# Patient Record
Sex: Female | Born: 1950 | Race: White | Hispanic: No | Marital: Married | Smoking: Current every day smoker
Health system: Southern US, Community
[De-identification: ages and names within clinical notes are randomized; demographics above are authoritative.]

## PROBLEM LIST (undated history)

## (undated) DIAGNOSIS — Z98811 Dental restoration status: Secondary | ICD-10-CM

## (undated) DIAGNOSIS — M65319 Trigger thumb, unspecified thumb: Secondary | ICD-10-CM

## (undated) DIAGNOSIS — J349 Unspecified disorder of nose and nasal sinuses: Secondary | ICD-10-CM

## (undated) HISTORY — PX: ENDOMETRIAL ABLATION: SHX621

## (undated) HISTORY — PX: TONSILLECTOMY: SUR1361

## (undated) HISTORY — PX: OVARIAN CYST REMOVAL: SHX89

## (undated) HISTORY — PX: LAPAROSCOPY FOR ECTOPIC PREGNANCY: SUR765

---

## 1997-08-19 ENCOUNTER — Emergency Department (HOSPITAL_COMMUNITY): Admission: EM | Admit: 1997-08-19 | Discharge: 1997-08-19 | Payer: Self-pay | Admitting: Internal Medicine

## 1999-05-03 ENCOUNTER — Encounter: Admission: RE | Admit: 1999-05-03 | Discharge: 1999-05-03 | Payer: Self-pay | Admitting: Gynecology

## 1999-05-03 ENCOUNTER — Encounter: Payer: Self-pay | Admitting: Gynecology

## 1999-05-05 ENCOUNTER — Other Ambulatory Visit: Admission: RE | Admit: 1999-05-05 | Discharge: 1999-05-05 | Payer: Self-pay | Admitting: Gynecology

## 2001-11-28 ENCOUNTER — Other Ambulatory Visit: Admission: RE | Admit: 2001-11-28 | Discharge: 2001-11-28 | Payer: Self-pay | Admitting: Gynecology

## 2008-09-21 ENCOUNTER — Ambulatory Visit: Payer: Self-pay | Admitting: Diagnostic Radiology

## 2008-09-21 ENCOUNTER — Emergency Department (HOSPITAL_BASED_OUTPATIENT_CLINIC_OR_DEPARTMENT_OTHER): Admission: EM | Admit: 2008-09-21 | Discharge: 2008-09-21 | Payer: Self-pay | Admitting: Emergency Medicine

## 2009-02-25 ENCOUNTER — Encounter: Admission: RE | Admit: 2009-02-25 | Discharge: 2009-02-25 | Payer: Self-pay | Admitting: Family Medicine

## 2012-07-16 DIAGNOSIS — M65319 Trigger thumb, unspecified thumb: Secondary | ICD-10-CM

## 2012-07-16 HISTORY — DX: Trigger thumb, unspecified thumb: M65.319

## 2012-07-26 ENCOUNTER — Other Ambulatory Visit: Payer: Self-pay | Admitting: Orthopedic Surgery

## 2012-08-06 ENCOUNTER — Encounter (HOSPITAL_BASED_OUTPATIENT_CLINIC_OR_DEPARTMENT_OTHER): Payer: Self-pay | Admitting: *Deleted

## 2012-08-13 ENCOUNTER — Encounter (HOSPITAL_BASED_OUTPATIENT_CLINIC_OR_DEPARTMENT_OTHER)
Admission: RE | Disposition: A | Discharge status: Home / Self Care | Payer: Self-pay | Source: Ambulatory Visit | Attending: Orthopedic Surgery

## 2012-08-13 ENCOUNTER — Ambulatory Visit (HOSPITAL_BASED_OUTPATIENT_CLINIC_OR_DEPARTMENT_OTHER): Payer: BC Managed Care – PPO | Admitting: *Deleted

## 2012-08-13 ENCOUNTER — Encounter (HOSPITAL_BASED_OUTPATIENT_CLINIC_OR_DEPARTMENT_OTHER): Payer: Self-pay | Admitting: *Deleted

## 2012-08-13 ENCOUNTER — Ambulatory Visit (HOSPITAL_BASED_OUTPATIENT_CLINIC_OR_DEPARTMENT_OTHER)
Admission: RE | Admit: 2012-08-13 | Discharge: 2012-08-13 | Disposition: A | Discharge status: Home / Self Care | Payer: BC Managed Care – PPO | Source: Ambulatory Visit | Attending: Orthopedic Surgery | Admitting: Orthopedic Surgery

## 2012-08-13 DIAGNOSIS — F17200 Nicotine dependence, unspecified, uncomplicated: Secondary | ICD-10-CM | POA: Insufficient documentation

## 2012-08-13 DIAGNOSIS — M6530 Trigger finger, unspecified finger: Secondary | ICD-10-CM | POA: Insufficient documentation

## 2012-08-13 HISTORY — DX: Dental restoration status: Z98.811

## 2012-08-13 HISTORY — DX: Trigger thumb, unspecified thumb: M65.319

## 2012-08-13 HISTORY — DX: Unspecified disorder of nose and nasal sinuses: J34.9

## 2012-08-13 HISTORY — PX: TRIGGER FINGER RELEASE: SHX641

## 2012-08-13 SURGERY — RELEASE, A1 PULLEY, FOR TRIGGER FINGER
Anesthesia: General | Site: Thumb | Laterality: Bilateral | Wound class: Clean

## 2012-08-13 MED ORDER — MIDAZOLAM HCL 2 MG/ML PO SYRP: 12.0000 mg | ORAL_SOLUTION | Freq: Once | ORAL | Status: DC | PRN

## 2012-08-13 MED ORDER — BUPIVACAINE HCL (PF) 0.25 % IJ SOLN
INTRAMUSCULAR | Status: DC | PRN
  Administered 2012-08-13: 5 mL

## 2012-08-13 MED ORDER — LACTATED RINGERS IV SOLN
INTRAVENOUS | Status: DC
  Administered 2012-08-13: 09:00:00 via INTRAVENOUS

## 2012-08-13 MED ORDER — DEXAMETHASONE SODIUM PHOSPHATE 10 MG/ML IJ SOLN
INTRAMUSCULAR | Status: DC | PRN
  Administered 2012-08-13: 10 mg via INTRAVENOUS

## 2012-08-13 MED ORDER — CHLORHEXIDINE GLUCONATE 4 % EX LIQD: 60.0000 mL | Freq: Once | CUTANEOUS | Status: DC

## 2012-08-13 MED ORDER — LIDOCAINE HCL (CARDIAC) 20 MG/ML IV SOLN
INTRAVENOUS | Status: DC | PRN
  Administered 2012-08-13: 60 mg via INTRAVENOUS

## 2012-08-13 MED ORDER — PROPOFOL 10 MG/ML IV BOLUS
INTRAVENOUS | Status: DC | PRN
  Administered 2012-08-13: 150 mg via INTRAVENOUS

## 2012-08-13 MED ORDER — HYDROCODONE-ACETAMINOPHEN 5-325 MG PO TABS: 1.0000 | ORAL_TABLET | Freq: Four times a day (QID) | ORAL | Status: AC | PRN

## 2012-08-13 MED ORDER — OXYCODONE HCL 5 MG PO TABS: 5.0000 mg | ORAL_TABLET | Freq: Once | ORAL | Status: DC | PRN

## 2012-08-13 MED ORDER — OXYCODONE HCL 5 MG/5ML PO SOLN: 5.0000 mg | Freq: Once | ORAL | Status: DC | PRN

## 2012-08-13 MED ORDER — 0.9 % SODIUM CHLORIDE (POUR BTL) OPTIME
TOPICAL | Status: DC | PRN
  Administered 2012-08-13: 100 mL

## 2012-08-13 MED ORDER — MIDAZOLAM HCL 2 MG/2ML IJ SOLN: 1.0000 mg | INTRAMUSCULAR | Status: DC | PRN

## 2012-08-13 MED ORDER — FENTANYL CITRATE 0.05 MG/ML IJ SOLN
INTRAMUSCULAR | Status: DC | PRN
  Administered 2012-08-13: 100 ug via INTRAVENOUS

## 2012-08-13 MED ORDER — ONDANSETRON HCL 4 MG/2ML IJ SOLN
INTRAMUSCULAR | Status: DC | PRN
  Administered 2012-08-13: 4 mg via INTRAVENOUS

## 2012-08-13 MED ORDER — FENTANYL CITRATE 0.05 MG/ML IJ SOLN: 50.0000 ug | INTRAMUSCULAR | Status: DC | PRN

## 2012-08-13 MED ORDER — HYDROMORPHONE HCL PF 1 MG/ML IJ SOLN: 0.2500 mg | INTRAMUSCULAR | Status: DC | PRN

## 2012-08-13 MED ORDER — MIDAZOLAM HCL 5 MG/5ML IJ SOLN
INTRAMUSCULAR | Status: DC | PRN
  Administered 2012-08-13: 2 mg via INTRAVENOUS

## 2012-08-13 MED ORDER — CEFAZOLIN SODIUM-DEXTROSE 2-3 GM-% IV SOLR
2.0000 g | INTRAVENOUS | Status: AC
  Administered 2012-08-13: 2 g via INTRAVENOUS

## 2012-08-13 SURGICAL SUPPLY — 38 items
BANDAGE COBAN STERILE 2 (GAUZE/BANDAGES/DRESSINGS) ×2 IMPLANT
BLADE SURG 15 STRL LF DISP TIS (BLADE) ×1 IMPLANT
BLADE SURG 15 STRL SS (BLADE) ×1
BNDG ESMARK 4X9 LF (GAUZE/BANDAGES/DRESSINGS) ×2 IMPLANT
CHLORAPREP W/TINT 26ML (MISCELLANEOUS) ×4 IMPLANT
CLOTH BEACON ORANGE TIMEOUT ST (SAFETY) ×2 IMPLANT
CORDS BIPOLAR (ELECTRODE) IMPLANT
COVER MAYO STAND STRL (DRAPES) ×4 IMPLANT
COVER TABLE BACK 60X90 (DRAPES) ×2 IMPLANT
CUFF TOURNIQUET SINGLE 18IN (TOURNIQUET CUFF) ×4 IMPLANT
DECANTER SPIKE VIAL GLASS SM (MISCELLANEOUS) IMPLANT
DERMABOND ADVANCED (GAUZE/BANDAGES/DRESSINGS) ×2
DERMABOND ADVANCED .7 DNX12 (GAUZE/BANDAGES/DRESSINGS) ×2 IMPLANT
DRAPE EXTREMITY T 121X128X90 (DRAPE) ×4 IMPLANT
DRAPE SURG 17X23 STRL (DRAPES) ×4 IMPLANT
GAUZE XEROFORM 1X8 LF (GAUZE/BANDAGES/DRESSINGS) ×2 IMPLANT
GLOVE BIO SURGEON STRL SZ 6.5 (GLOVE) IMPLANT
GLOVE BIOGEL PI IND STRL 7.0 (GLOVE) ×1 IMPLANT
GLOVE BIOGEL PI IND STRL 8.5 (GLOVE) ×1 IMPLANT
GLOVE BIOGEL PI INDICATOR 7.0 (GLOVE) ×1
GLOVE BIOGEL PI INDICATOR 8.5 (GLOVE) ×1
GLOVE ECLIPSE 6.5 STRL STRAW (GLOVE) ×2 IMPLANT
GLOVE EXAM NITRILE MD LF STRL (GLOVE) ×2 IMPLANT
GLOVE SURG ORTHO 8.0 STRL STRW (GLOVE) ×4 IMPLANT
GOWN BRE IMP PREV XXLGXLNG (GOWN DISPOSABLE) ×2 IMPLANT
GOWN PREVENTION PLUS XLARGE (GOWN DISPOSABLE) ×2 IMPLANT
NEEDLE 27GAX1X1/2 (NEEDLE) ×2 IMPLANT
NS IRRIG 1000ML POUR BTL (IV SOLUTION) ×2 IMPLANT
PACK BASIN DAY SURGERY FS (CUSTOM PROCEDURE TRAY) ×2 IMPLANT
PADDING CAST ABS 4INX4YD NS (CAST SUPPLIES)
PADDING CAST ABS COTTON 4X4 ST (CAST SUPPLIES) IMPLANT
SPONGE GAUZE 4X4 12PLY (GAUZE/BANDAGES/DRESSINGS) ×2 IMPLANT
STOCKINETTE 4X48 STRL (DRAPES) ×4 IMPLANT
SUT VICRYL RAPIDE 4/0 PS 2 (SUTURE) ×2 IMPLANT
SYR BULB 3OZ (MISCELLANEOUS) ×2 IMPLANT
SYR CONTROL 10ML LL (SYRINGE) ×2 IMPLANT
TOWEL OR 17X24 6PK STRL BLUE (TOWEL DISPOSABLE) ×2 IMPLANT
UNDERPAD 30X30 INCONTINENT (UNDERPADS AND DIAPERS) ×4 IMPLANT

## 2012-08-13 NOTE — Anesthesia Procedure Notes (Signed)
Procedure Name: LMA Insertion Date/Time: 08/13/2012 9:28 AM Performed by: Meyer Russel Pre-anesthesia Checklist: Patient identified, Emergency Drugs available, Suction available and Patient being monitored Patient Re-evaluated:Patient Re-evaluated prior to inductionOxygen Delivery Method: Circle System Utilized Preoxygenation: Pre-oxygenation with 100% oxygen Intubation Type: IV induction Ventilation: Mask ventilation without difficulty LMA: LMA inserted LMA Size: 4.0 Number of attempts: 1 Airway Equipment and Method: bite block Placement Confirmation: positive ETCO2 and breath sounds checked- equal and bilateral Tube secured with: Tape Dental Injury: Teeth and Oropharynx as per pre-operative assessment

## 2012-08-13 NOTE — Brief Op Note (Signed)
08/13/2012  10:08 AM  PATIENT:  Ana Harrison  62 y.o. female  PRE-OPERATIVE DIAGNOSIS:  BILATERAL STENOSING TENOSYNOVITIS THUMBS  POST-OPERATIVE DIAGNOSIS:  BILATERAL STENOSING TENOSYNOVITIS THUMBS  PROCEDURE:  Procedure(s): RELEASE TRIGGER FINGER/A-1 PULLEY (Bilateral)  SURGEON:  Surgeon(s) and Role:    * Nicki Reaper, MD - Primary  PHYSICIAN ASSISTANT:   ASSISTANTS: none   ANESTHESIA:   local and general  EBL:  Total I/O In: 750 [I.V.:750] Out: -   BLOOD ADMINISTERED:none  DRAINS: none   LOCAL MEDICATIONS USED:  MARCAINE     SPECIMEN:  No Specimen  DISPOSITION OF SPECIMEN:  N/A  COUNTS:  YES  TOURNIQUET:   Total Tourniquet Time Documented: Forearm (Right) - 13 minutes Forearm (Right) - 13 minutes Total: Forearm (Right) - 26 minutes   DICTATION: .Other Dictation: Dictation Number [id number]  PLAN OF CARE: Discharge to home after PACU  PATIENT DISPOSITION:  PACU - hemodynamically stable.

## 2012-08-13 NOTE — Transfer of Care (Signed)
Immediate Anesthesia Transfer of Care Note  Patient: Ana Harrison  Procedure(s) Performed: Procedure(s): RELEASE TRIGGER FINGER/A-1 PULLEY (Bilateral)  Patient Location: PACU  Anesthesia Type:General  Level of Consciousness: awake, alert  and patient cooperative  Airway & Oxygen Therapy: Patient Spontanous Breathing and Patient connected to face mask oxygen  Post-op Assessment: Report given to PACU RN, Post -op Vital signs reviewed and stable and Patient moving all extremities  Post vital signs: Reviewed and stable  Complications: No apparent anesthesia complications

## 2012-08-13 NOTE — Anesthesia Postprocedure Evaluation (Signed)
  Anesthesia Post-op Note  Patient: Ana Harrison  Procedure(s) Performed: Procedure(s): RELEASE TRIGGER FINGER/A-1 PULLEY (Bilateral)  Patient Location: PACU  Anesthesia Type:General  Level of Consciousness: awake, alert  and oriented  Airway and Oxygen Therapy: Patient Spontanous Breathing  Post-op Pain: none  Post-op Assessment: Post-op Vital signs reviewed, Patient's Cardiovascular Status Stable, Respiratory Function Stable, Patent Airway and No signs of Nausea or vomiting  Post-op Vital Signs: Reviewed and stable  Complications: No apparent anesthesia complications

## 2012-08-13 NOTE — Op Note (Signed)
Dictation Number [id number]

## 2012-08-13 NOTE — Anesthesia Preprocedure Evaluation (Addendum)
Anesthesia Evaluation  Patient identified by MRN, date of birth, ID band Patient awake    Reviewed: Allergy & Precautions, H&P , NPO status , Patient's Chart, lab work & pertinent test results  Airway Mallampati: II TM Distance: >3 FB Neck ROM: Full    Dental no notable dental hx. (+) Teeth Intact and Dental Advisory Given   Pulmonary Current Smoker,  breath sounds clear to auscultation  Pulmonary exam normal       Cardiovascular negative cardio ROS  Rhythm:Regular Rate:Normal     Neuro/Psych negative neurological ROS  negative psych ROS   GI/Hepatic negative GI ROS, Neg liver ROS,   Endo/Other  negative endocrine ROS  Renal/GU negative Renal ROS  negative genitourinary   Musculoskeletal   Abdominal   Peds  Hematology negative hematology ROS (+)   Anesthesia Other Findings   Reproductive/Obstetrics negative OB ROS                         Anesthesia Physical Anesthesia Plan  ASA: II  Anesthesia Plan: General   Post-op Pain Management:    Induction: Intravenous  Airway Management Planned: LMA  Additional Equipment:   Intra-op Plan:   Post-operative Plan: Extubation in OR  Informed Consent: I have reviewed the patients History and Physical, chart, labs and discussed the procedure including the risks, benefits and alternatives for the proposed anesthesia with the patient or authorized representative who has indicated his/her understanding and acceptance.   Dental advisory given  Plan Discussed with: CRNA  Anesthesia Plan Comments:         Anesthesia Quick Evaluation  

## 2012-08-13 NOTE — H&P (Signed)
Ana Harrison is a 62 year-old right-hand dominant female with  bilateral thumb triggering.  This has been going on for three months on her left side, two months on her right side. She has been taking ibuprofen in a thumb spica splint without significant relief.  She was recently given Medrol Dosepak which was negative.  X-rays are negative.  She has been referred.  She complains of her right side being unable to flex, her left side simply catches.  She complains of intermittent, moderate to severe aching type pain, this will occasionally awaken her from sleep.  Using them causes increased discomfort for her.   ALLERGIES:     None.  MEDICATIONS:      None.  SURGICAL HISTORY:      She has had two C-sections.  FAMILY MEDICAL HISTORY:    Positive for diabetes, heart disease, high blood pressure.  SOCIAL HISTORY:      She smokes half a pack a day and is quitting.  She is advised the importance of this.   Alcohol use is negative.    REVIEW OF SYSTEMS:    Positive for pneumonia, otherwise negative 14 points.      Ana Harrison is an 62 y.o. female.   Chief Complaint: Bil trigger thumbs HPI: see above  Past Medical History  Diagnosis Date  . Sinus problem   . Dental crowns present   . 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 Cesarean section      x 2  . Laparoscopy for ectopic pregnancy    . Endometrial ablation    . Tonsillectomy    . Ovarian cyst removal      History reviewed. No pertinent family history. Social History:  reports that she has been smoking Cigarettes.  She has a 17.5 pack-year smoking history. She has never used smokeless tobacco. She reports that  drinks alcohol. She reports that she does not use illicit drugs.  Allergies: No Known Allergies  Medications Prior to Admission  Medication Sig Dispense Refill  . b complex vitamins tablet Take 1 tablet by mouth daily.      . calcium carbonate (OS-CAL) 600  MG TABS Take 600 mg by mouth 2 (two) times daily with a meal.      . cholecalciferol (VITAMIN D) 1000 UNITS tablet Take 1,000 Units by mouth daily.      Marland Kitchen co-enzyme Q-10 50 MG capsule Take 50 mg by mouth daily.      Marland Kitchen ibuprofen (ADVIL,MOTRIN) 200 MG tablet Take 200 mg by mouth every 6 (six) hours as needed for pain.      . vitamin E 100 UNIT capsule Take 100 Units by mouth daily.        No results found for this or any previous visit (from the past 48 hour(s)).  No results found.   Pertinent items are noted in HPI.  Blood pressure 143/84, pulse 91, temperature 98.3 F (36.8 C), temperature source Oral, resp. rate 18, height 5\' 4"  (1.626 m), weight 84.913 kg (187 lb 3.2 oz), SpO2 99.00%.  General appearance: alert, cooperative and appears stated age Head: Normocephalic, without obvious abnormality Neck: no JVD Resp: clear to auscultation bilaterally Cardio: regular rate and rhythm, S1, S2 normal, no murmur, click, rub or gallop GI: soft, non-tender; bowel sounds normal; no masses,  no organomegaly Extremities: extremities normal, atraumatic, no cyanosis or edema Pulses: 2+ and symmetric Skin: Skin color,  texture, turgor normal. No rashes or lesions Neurologic: Grossly normal Incision/Wound: na  Assessment/Plan DIAGNOSIS:      Stenosing tenosynovitis bilateral thumbs.  RECOMMENDATIONS/PLAN:     We have discussed the possibility of injections with her.  She is advised we would attempt two injections.  Should this not resolve this for her then surgical release would be necessary.  She would like to forego this.  She would like to proceed to have both sides done at the same time.  She is aware that there is no guarantee with the surgery, the possibility of infection, recurrence, injury to arteries, nerves, tendons, incomplete relief of symptoms and dystrophy.    She is scheduled for bilateral trigger thumb release as an outpatient.   Ethridge Sollenberger R 08/13/2012, 8:25 AM

## 2012-08-14 ENCOUNTER — Encounter (HOSPITAL_BASED_OUTPATIENT_CLINIC_OR_DEPARTMENT_OTHER): Payer: Self-pay | Admitting: Orthopedic Surgery

## 2012-08-14 NOTE — Op Note (Signed)
Ana Harrison, Ana Harrison                ACCOUNT NO.:  [id number]  MEDICAL RECORD NO.:  [id number]  LOCATION:                                 FACILITY:  PHYSICIAN:  Cindee Salt, M.D.            DATE OF BIRTH:  DATE OF PROCEDURE:  08/13/2012 DATE OF DISCHARGE:                              OPERATIVE REPORT   PREOPERATIVE DIAGNOSIS:  Bilateral stenosing tenosynovitis, thumbs.  POSTOPERATIVE DIAGNOSIS:  Bilateral stenosing tenosynovitis, thumbs.  OPERATION:  Release of A1 pulley, bilateral thumbs.  SURGEON:  Cindee Salt, MD  ANESTHESIA:  General with local infiltration.  ANESTHESIOLOGIST:  Zenon Mayo, MD  HISTORY:  The patient is a 62 year old female with a history of bilateral triggering of her thumb.  She has elected to undergo bilateral release of the trigger fingers.  Pre, peri, and postoperative course have been discussed along with risks and complications.  She is aware there is no guarantee with surgery; possibility of infection; recurrence of injury to arteries, nerves, tendons; incomplete relief of symptoms; dystrophy.  In the preoperative area, the patient was seen, the extremity marked by both the patient and surgeon.  Antibiotic given.  PROCEDURE IN DETAIL:  The patient was brought to the operating room where a general anesthetic was carried out without difficulty.  She was prepped using ChloraPrep in supine position.  The right hand was approached first.  A limb was exsanguinated with an Esmarch bandage. The tourniquet was placed on forearm, was inflated to 225 mmHg. Transverse incision was made over the A1 pulley of the right thumb, carried down through subcutaneous tissue.  Retractors were placed, protecting neurovascular bundles radially and ulnarly.  The A1 pulley was found to be markedly thick.  This was released on its radial aspect, protecting the oblique pulley.  A tenosynovectomy performed proximally with blunt dissection.  The finger placed through  full range of motion, no further triggering was noted.  The wound was irrigated and closed with subcuticular 4-0 Vicryl Rapide and sealed with Dermabond.  A sterile compressive dressing was applied.  Tourniquet deflated.  The left side was then approached after changing gloves.  A limb was exsanguinated with an Esmarch bandage.  Tourniquet placed on the upper arm and was inflated to 250 mmHg.  Transverse incision was made over the A1 pulley of the left thumb, carried down through subcutaneous tissue. Again, retractors placed, protecting the radial and ulnar neurovascular bundles.  The A1 pulley was released on its radial aspect, protecting the oblique pulley.  A tenosynovectomy was performed proximally with blunt dissection.  The wound irrigated and the thumb placed through a full range of motion, no further triggering was noted.  The skin closed with subcuticular 4-0 Vicryl Rapide and sealed with Dermabond.  Sterile compressive dressing was applied.  On deflation of the tourniquet, all fingers immediately pinked.  She was taken to the recovery room for observation in satisfactory condition. She will be discharged home to return in 1 week on Norco.          ______________________________ Cindee Salt, M.D.     GK/MEDQ  D:  08/13/2012  T:  08/14/2012  Job:  [id number] 

## 2014-01-21 ENCOUNTER — Encounter: Payer: Self-pay | Admitting: *Deleted

## 2014-01-21 ENCOUNTER — Emergency Department
Admission: EM | Admit: 2014-01-21 | Discharge: 2014-01-21 | Disposition: A | Discharge status: Home / Self Care | Payer: BLUE CROSS/BLUE SHIELD | Source: Home / Self Care | Attending: Family Medicine | Admitting: Family Medicine

## 2014-01-21 ENCOUNTER — Emergency Department (INDEPENDENT_AMBULATORY_CARE_PROVIDER_SITE_OTHER): Payer: BLUE CROSS/BLUE SHIELD

## 2014-01-21 DIAGNOSIS — F17200 Nicotine dependence, unspecified, uncomplicated: Secondary | ICD-10-CM

## 2014-01-21 DIAGNOSIS — R05 Cough: Secondary | ICD-10-CM

## 2014-01-21 DIAGNOSIS — R059 Cough, unspecified: Secondary | ICD-10-CM

## 2014-01-21 DIAGNOSIS — R0789 Other chest pain: Secondary | ICD-10-CM

## 2014-01-21 DIAGNOSIS — J984 Other disorders of lung: Secondary | ICD-10-CM

## 2014-01-21 DIAGNOSIS — J209 Acute bronchitis, unspecified: Secondary | ICD-10-CM

## 2014-01-21 MED ORDER — HYDROCOD POLST-CHLORPHEN POLST 10-8 MG/5ML PO LQCR: 5.0000 mL | Freq: Every evening | ORAL | Status: AC | PRN

## 2014-01-21 MED ORDER — DOXYCYCLINE HYCLATE 100 MG PO CAPS: 100.0000 mg | ORAL_CAPSULE | Freq: Two times a day (BID) | ORAL | Status: AC

## 2014-01-21 NOTE — ED Provider Notes (Signed)
CSN: [id number]     Arrival date & time 01/21/14  1940 History   First MD Initiated Contact with Patient 01/21/14 2030     Chief Complaint  Patient presents with  . Chest Pain      HPI Comments: About two weeks ago patient developed typical cold-like symptoms including mild sore throat, sinus congestion, headache, fatigue, and cough.  The cough has persisted and become worse.  Three days ago she developed pain in her upper anterior chest when coughing.  She wonders if she "pulled a muscle."  No shortness of breath.  No fevers, chills, and sweats. She continues to smoke.  The history is provided by the patient.    Past Medical History  Diagnosis Date  . Sinus problem   . Dental crowns present   . Stenosing tenosynovitis of thumb 07/2012    bilateral   Past Surgical History  Procedure Laterality Date  . Cesarean section      x 2  . Laparoscopy for ectopic pregnancy    . Endometrial ablation    . Tonsillectomy    . Ovarian cyst removal    . Trigger finger release Bilateral 08/13/2012    Procedure: RELEASE TRIGGER FINGER/A-1 PULLEY;  Surgeon: Wynonia Sours, MD;  Location: [facility];  Service: Orthopedics;  Laterality: Bilateral;   Family History  Problem Relation Age of Onset  . Heart disease Mother   . Kidney failure Father   . Atrial fibrillation Father    History  Substance Use Topics  . Smoking status: Current Every Day Smoker -- 1.00 packs/day for 35 years    Types: Cigarettes  . Smokeless tobacco: Never Used  . Alcohol Use: Yes     Comment: occasionally   OB History    No data available     Review of Systems No sore throat + cough ? pleuritic pain No wheezing + nasal congestion No post-nasal drainage No sinus pain/pressure No itchy/red eyes No earache No hemoptysis No SOB No fever/chills No nausea No vomiting No abdominal pain No diarrhea No urinary symptoms No skin rash No fatigue No myalgias + headache Used OTC meds without relief   Allergies  Review of patient's allergies indicates no known allergies.  Home Medications   Prior to Admission medications   Medication Sig Start Date End Date Taking? Authorizing Provider  b complex vitamins tablet Take 1 tablet by mouth daily.    Historical Provider, MD  calcium carbonate (OS-CAL) 600 MG TABS Take 600 mg by mouth 2 (two) times daily with a meal.    Historical Provider, MD  chlorpheniramine-HYDROcodone (TUSSIONEX PENNKINETIC ER) 10-8 MG/5ML LQCR Take 5 mLs by mouth at bedtime as needed for cough. 01/21/14   Kandra Nicolas, MD  cholecalciferol (VITAMIN D) 1000 UNITS tablet Take 1,000 Units by mouth daily.    Historical Provider, MD  co-enzyme Q-10 50 MG capsule Take 50 mg by mouth daily.    Historical Provider, MD  doxycycline (VIBRAMYCIN) 100 MG capsule Take 1 capsule (100 mg total) by mouth 2 (two) times daily. Take with food. 01/21/14   Kandra Nicolas, MD  HYDROcodone-acetaminophen (NORCO) 5-325 MG per tablet Take 1 tablet by mouth every 6 (six) hours as needed for pain. 08/13/12   Daryll Brod, MD  ibuprofen (ADVIL,MOTRIN) 200 MG tablet Take 200 mg by mouth every 6 (six) hours as needed for pain.    Historical Provider, MD  vitamin E 100 UNIT capsule Take 100 Units by mouth daily.  Historical Provider, MD   BP 167/82 mmHg  Pulse 86  Temp(Src) 98 F (36.7 C) (Oral)  Resp 18  Ht 5\' 4"  (1.626 m)  Wt 188 lb (85.276 kg)  BMI 32.25 kg/m2  SpO2 95% Physical Exam  Pulmonary/Chest:    There is tenderness to palpation over right upper chest and sternum as noted on diagram.     Nursing notes and Vital Signs reviewed. Appearance:  Patient appears stated age, and in no acute distress.  Patient is obese (BMI 32.3) Eyes:  Pupils are equal, round, and reactive to light and accomodation.  Extraocular movement is intact.  Conjunctivae are not inflamed  Ears:  Canals normal.  Tympanic membranes normal.  Nose:  Mildly congested turbinates.  No sinus tenderness.   Pharynx:   Normal Neck:  Supple.   Tender enlarged posterior nodes are palpated bilaterally  Lungs:  Clear to auscultation.  Breath sounds are equal.  Heart:  Regular rate and rhythm without murmurs, rubs, or gallops.  Abdomen:  Nontender without masses or hepatosplenomegaly.  Bowel sounds are present.  No CVA or flank tenderness.  Extremities:  No edema.  No calf tenderness Skin:  No rash present.   ED Course  Procedures  none    Imaging Review Dg Chest 2 View  01/21/2014   CLINICAL DATA:  Cough for 2 weeks. Right anterior upper chest pain. Tobacco use.  EXAM: CHEST  2 VIEW  COMPARISON:  None.  FINDINGS: Mild central airway thickening. Lungs appear otherwise clear. Cardiac and mediastinal margins appear normal. No pleural effusion. No airspace opacity identified.  IMPRESSION: 1. Airway thickening is present, suggesting bronchitis or reactive airways disease.   Electronically Signed   By: Sherryl Barters M.D.   On: 01/21/2014 20:17     MDM   1. Acute bronchitis, unspecified organism   2. Cough   3. Musculoskeletal chest pain    Begin doxycycline.  Tussionex at bedtime. Take plain Mucinex (1200 mg guaifenesin) twice daily for cough and congestion.  May add Pseudoephedrine for sinus congestion.   Increase fluid intake, rest. Try warm salt water gargles for sore throat.  Stop all antihistamines for now, and other non-prescription cough/cold preparations. May take Ibuprofen 200mg , 4 tabs every 8 hours with food for chest/sternum discomfort. Apply ice pack for 15 to 20 minutes, 3 to 4 times daily  Continue until pain decreases.  Follow-up with family doctor if not improving about10 days.    Kandra Nicolas, MD 01/24/14 1016

## 2014-01-21 NOTE — Discharge Instructions (Signed)
Take plain Mucinex (1200 mg guaifenesin) twice daily for cough and congestion.  May add Pseudoephedrine for sinus congestion.   Increase fluid intake, rest. Try warm salt water gargles for sore throat.  Stop all antihistamines for now, and other non-prescription cough/cold preparations. May take Ibuprofen 200mg , 4 tabs every 8 hours with food for chest/sternum discomfort. Apply ice pack for 15 to 20 minutes, 3 to 4 times daily  Continue until pain decreases.  Follow-up with family doctor if not improving about10 days.    Chest Wall Pain Chest wall pain is pain in or around the bones and muscles of your chest. It may take up to 6 weeks to get better. It may take longer if you must stay physically active in your work and activities.  CAUSES  Chest wall pain may happen on its own. However, it may be caused by:  A viral illness like the flu.  Injury.  Coughing.  Exercise.  Arthritis.  Fibromyalgia.  Shingles. HOME CARE INSTRUCTIONS   Avoid overtiring physical activity. Try not to strain or perform activities that cause pain. This includes any activities using your chest or your abdominal and side muscles, especially if heavy weights are used.  Put ice on the sore area.  Put ice in a plastic bag.  Place a towel between your skin and the bag.  Leave the ice on for 15-20 minutes per hour while awake for the first 2 days.  Only take over-the-counter or prescription medicines for pain, discomfort, or fever as directed by your caregiver. SEEK IMMEDIATE MEDICAL CARE IF:   Your pain increases, or you are very uncomfortable.  You have a fever.  Your chest pain becomes worse.  You have new, unexplained symptoms.  You have nausea or vomiting.  You feel sweaty or lightheaded.  You have a cough with phlegm (sputum), or you cough up blood. MAKE SURE YOU:   Understand these instructions.  Will watch your condition.  Will get help right away if you are not doing well or get  worse. Document Released: 01/02/2005 Document Revised: 03/27/2011 Document Reviewed: 08/29/2010 [facility] Patient Information 2015 [place], [place]. This information is not intended to replace advice given to you by your health care provider. Make sure you discuss any questions you have with your health care provider.

## 2014-01-21 NOTE — ED Notes (Signed)
Pt c/o RT chest pain x 2 days. She also reports having URI s/s x 2wk. She reports "it feels like I pulled a muscle in my chest from coughing".

## 2015-07-04 IMAGING — CR DG CHEST 2V
2 series · 2 of 2 positions shown · non-contrast
Comparison: None.

CLINICAL DATA: Cough for 2 weeks. Right anterior upper chest pain.
Tobacco use.

EXAM:
CHEST  2 VIEW

[view not recorded (1 of 2)]
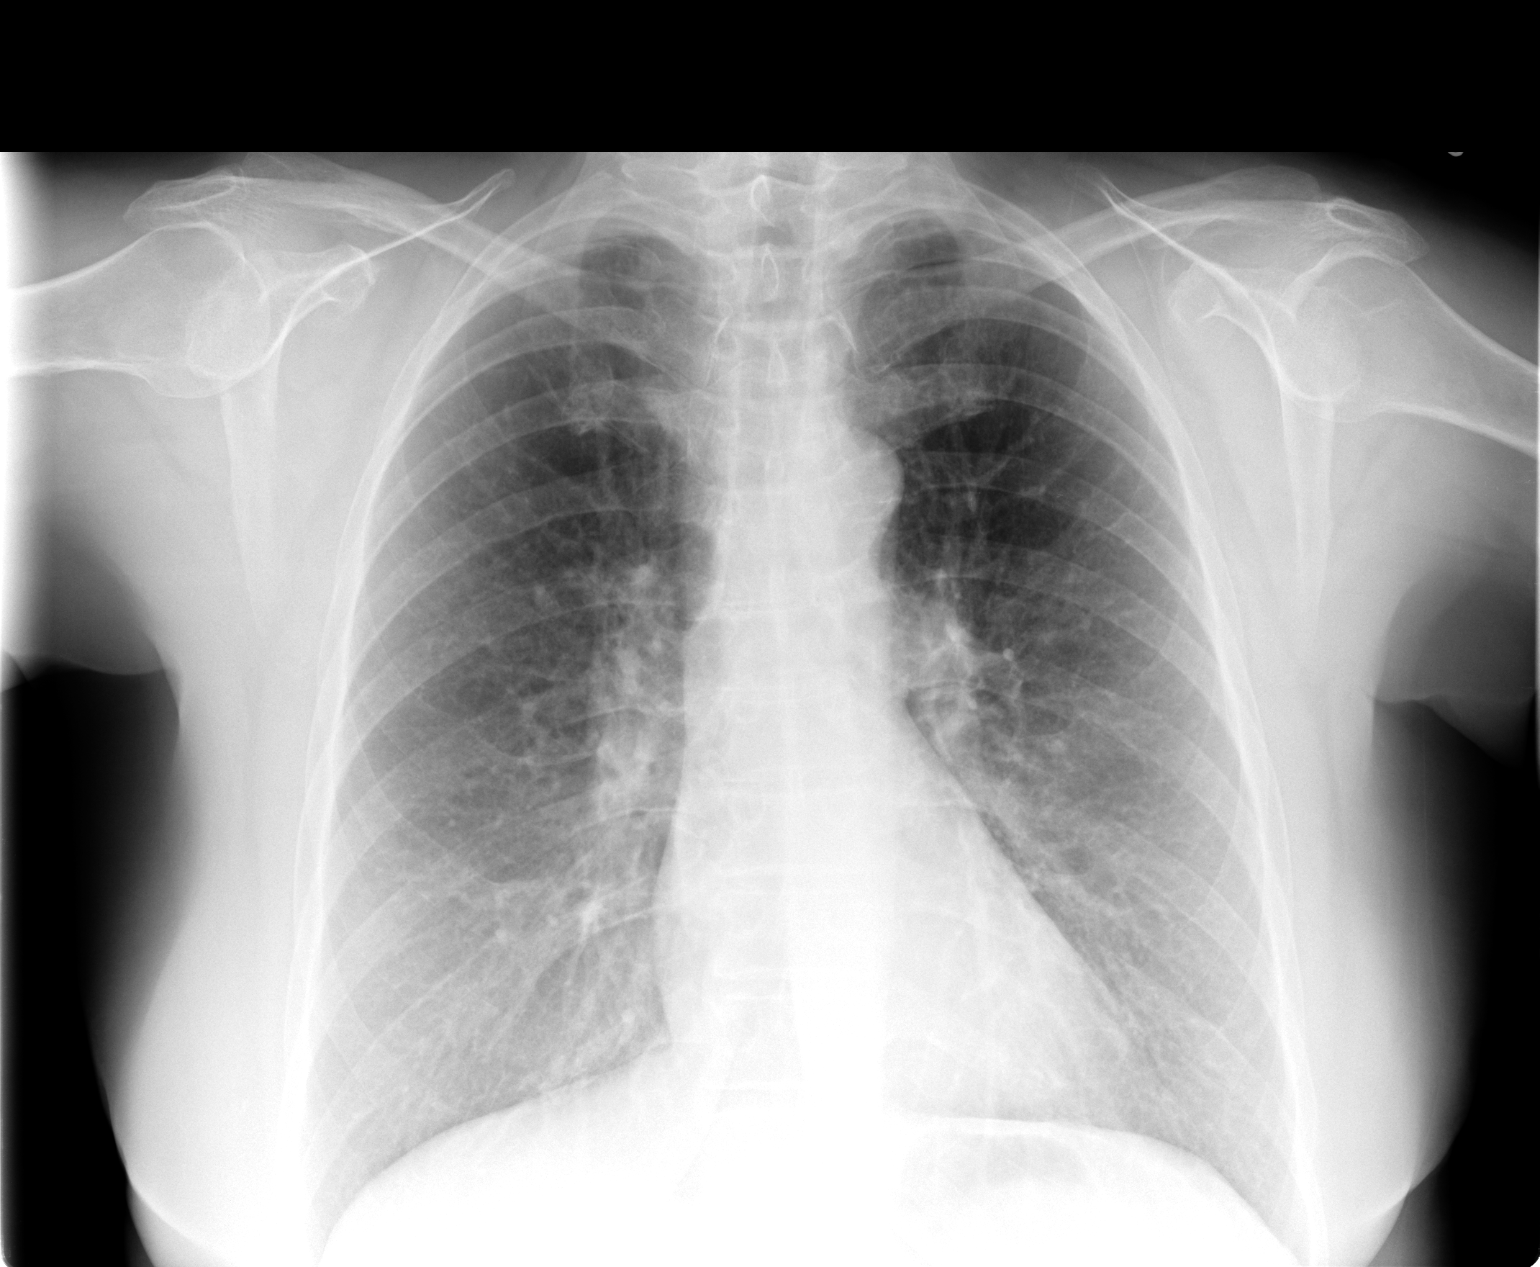

[view not recorded (2 of 2)]
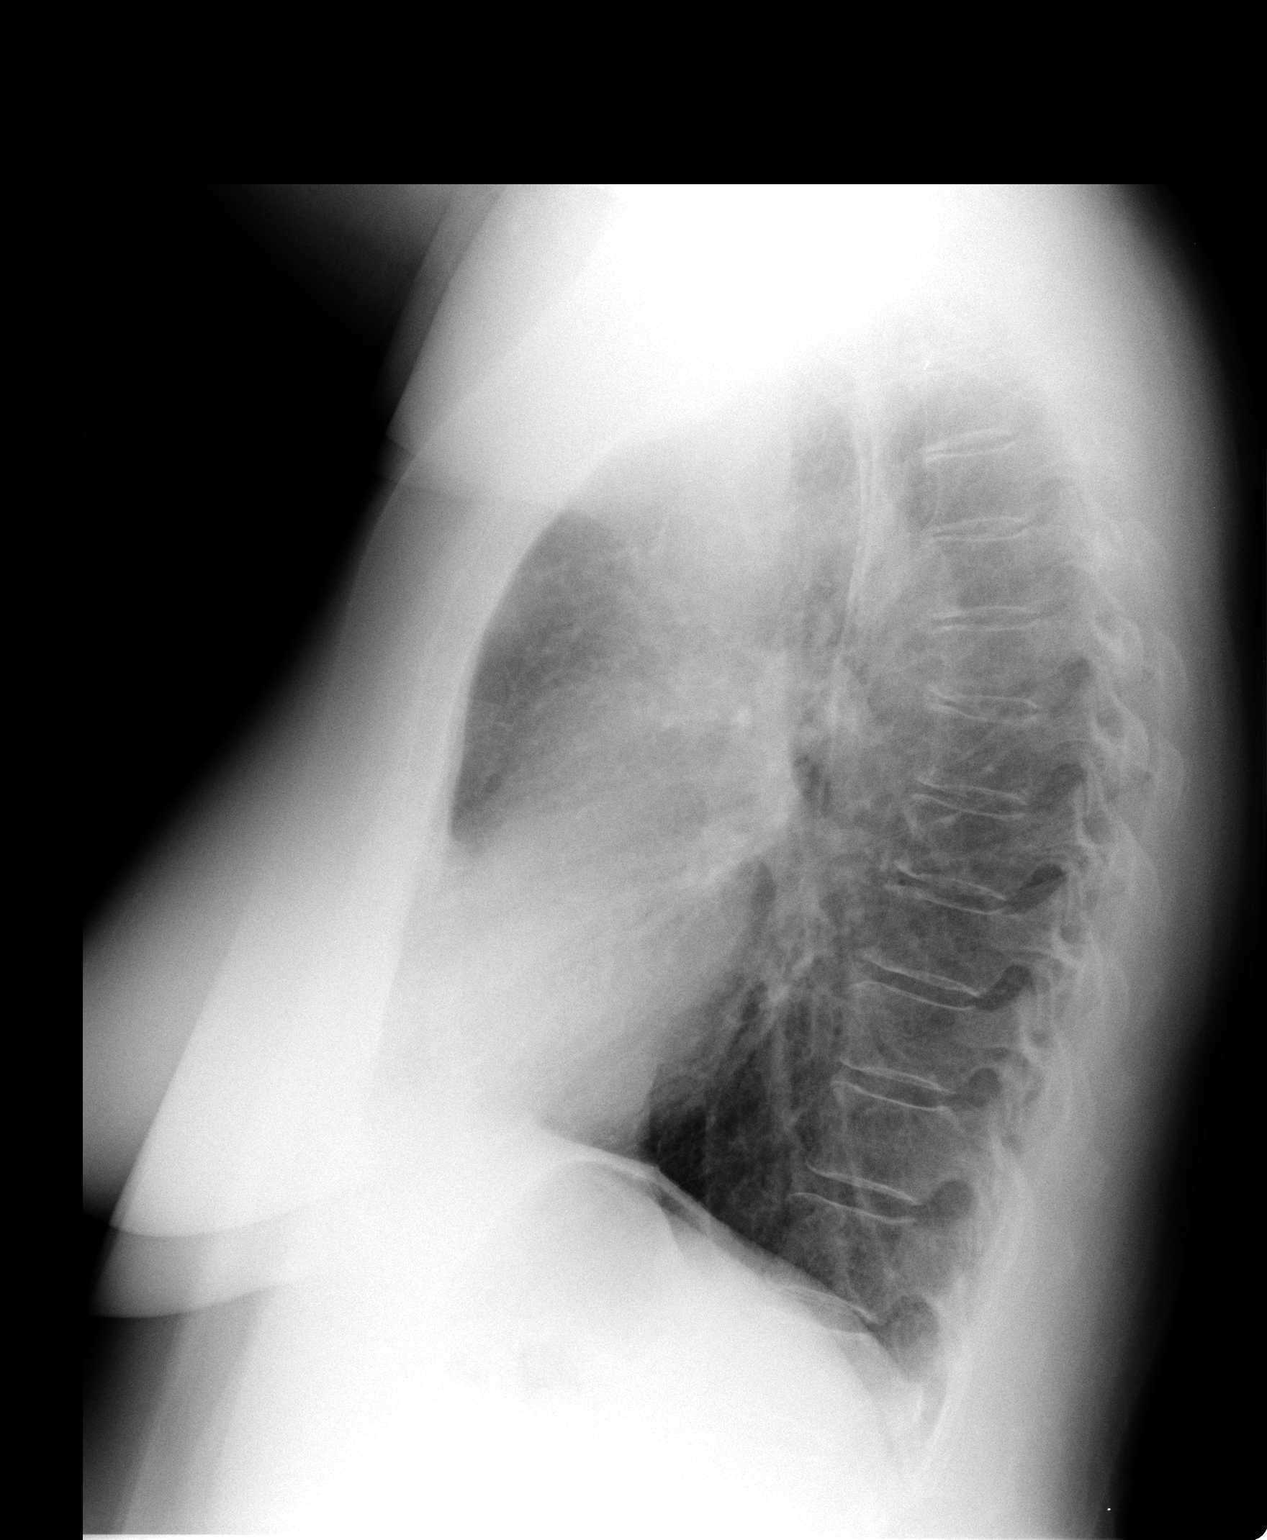

[2 of 2 positions shown; findings below may reference images not displayed]

FINDINGS: Mild central airway thickening. Lungs appear otherwise clear.
Cardiac and mediastinal margins appear normal. No pleural effusion.
No airspace opacity identified.
IMPRESSION: 1. Airway thickening is present, suggesting bronchitis or reactive
airways disease.

## 2020-04-14 DIAGNOSIS — C44622 Squamous cell carcinoma of skin of right upper limb, including shoulder: Secondary | ICD-10-CM | POA: Diagnosis not present

## 2020-04-21 DIAGNOSIS — C44622 Squamous cell carcinoma of skin of right upper limb, including shoulder: Secondary | ICD-10-CM | POA: Diagnosis not present

## 2020-08-18 DIAGNOSIS — C44622 Squamous cell carcinoma of skin of right upper limb, including shoulder: Secondary | ICD-10-CM | POA: Diagnosis not present

## 2020-08-18 DIAGNOSIS — D485 Neoplasm of uncertain behavior of skin: Secondary | ICD-10-CM | POA: Diagnosis not present

## 2021-07-08 DIAGNOSIS — R062 Wheezing: Secondary | ICD-10-CM | POA: Diagnosis not present

## 2021-07-08 DIAGNOSIS — R002 Palpitations: Secondary | ICD-10-CM | POA: Diagnosis not present

## 2021-07-08 DIAGNOSIS — F17200 Nicotine dependence, unspecified, uncomplicated: Secondary | ICD-10-CM | POA: Diagnosis not present

## 2022-04-27 DIAGNOSIS — H25813 Combined forms of age-related cataract, bilateral: Secondary | ICD-10-CM | POA: Diagnosis not present

## 2022-05-26 DIAGNOSIS — H527 Unspecified disorder of refraction: Secondary | ICD-10-CM | POA: Diagnosis not present

## 2022-05-26 DIAGNOSIS — H353211 Exudative age-related macular degeneration, right eye, with active choroidal neovascularization: Secondary | ICD-10-CM | POA: Diagnosis not present

## 2022-05-26 DIAGNOSIS — H25013 Cortical age-related cataract, bilateral: Secondary | ICD-10-CM | POA: Diagnosis not present

## 2022-06-05 DIAGNOSIS — H25012 Cortical age-related cataract, left eye: Secondary | ICD-10-CM | POA: Diagnosis not present

## 2022-06-07 DIAGNOSIS — H5315 Visual distortions of shape and size: Secondary | ICD-10-CM | POA: Diagnosis not present

## 2022-06-07 DIAGNOSIS — H353122 Nonexudative age-related macular degeneration, left eye, intermediate dry stage: Secondary | ICD-10-CM | POA: Diagnosis not present

## 2022-06-07 DIAGNOSIS — Z961 Presence of intraocular lens: Secondary | ICD-10-CM | POA: Diagnosis not present

## 2022-06-07 DIAGNOSIS — H353211 Exudative age-related macular degeneration, right eye, with active choroidal neovascularization: Secondary | ICD-10-CM | POA: Diagnosis not present

## 2022-06-07 DIAGNOSIS — H25811 Combined forms of age-related cataract, right eye: Secondary | ICD-10-CM | POA: Diagnosis not present

## 2022-06-07 DIAGNOSIS — H3561 Retinal hemorrhage, right eye: Secondary | ICD-10-CM | POA: Diagnosis not present

## 2022-07-10 DIAGNOSIS — H353211 Exudative age-related macular degeneration, right eye, with active choroidal neovascularization: Secondary | ICD-10-CM | POA: Diagnosis not present

## 2022-07-10 DIAGNOSIS — H353122 Nonexudative age-related macular degeneration, left eye, intermediate dry stage: Secondary | ICD-10-CM | POA: Diagnosis not present

## 2022-07-10 DIAGNOSIS — H3561 Retinal hemorrhage, right eye: Secondary | ICD-10-CM | POA: Diagnosis not present

## 2022-07-10 DIAGNOSIS — Z961 Presence of intraocular lens: Secondary | ICD-10-CM | POA: Diagnosis not present

## 2022-07-10 DIAGNOSIS — H2511 Age-related nuclear cataract, right eye: Secondary | ICD-10-CM | POA: Diagnosis not present

## 2022-08-14 DIAGNOSIS — H353211 Exudative age-related macular degeneration, right eye, with active choroidal neovascularization: Secondary | ICD-10-CM | POA: Diagnosis not present

## 2022-08-14 DIAGNOSIS — H353122 Nonexudative age-related macular degeneration, left eye, intermediate dry stage: Secondary | ICD-10-CM | POA: Diagnosis not present

## 2022-08-14 DIAGNOSIS — H2511 Age-related nuclear cataract, right eye: Secondary | ICD-10-CM | POA: Diagnosis not present

## 2022-08-14 DIAGNOSIS — Z961 Presence of intraocular lens: Secondary | ICD-10-CM | POA: Diagnosis not present

## 2022-08-14 DIAGNOSIS — H3561 Retinal hemorrhage, right eye: Secondary | ICD-10-CM | POA: Diagnosis not present

## 2022-09-11 DIAGNOSIS — Z961 Presence of intraocular lens: Secondary | ICD-10-CM | POA: Diagnosis not present

## 2022-09-11 DIAGNOSIS — H353211 Exudative age-related macular degeneration, right eye, with active choroidal neovascularization: Secondary | ICD-10-CM | POA: Diagnosis not present

## 2022-09-11 DIAGNOSIS — H3561 Retinal hemorrhage, right eye: Secondary | ICD-10-CM | POA: Diagnosis not present

## 2022-09-11 DIAGNOSIS — H353122 Nonexudative age-related macular degeneration, left eye, intermediate dry stage: Secondary | ICD-10-CM | POA: Diagnosis not present

## 2022-09-11 DIAGNOSIS — H2511 Age-related nuclear cataract, right eye: Secondary | ICD-10-CM | POA: Diagnosis not present

## 2022-10-09 DIAGNOSIS — H353211 Exudative age-related macular degeneration, right eye, with active choroidal neovascularization: Secondary | ICD-10-CM | POA: Diagnosis not present

## 2022-10-09 DIAGNOSIS — H353122 Nonexudative age-related macular degeneration, left eye, intermediate dry stage: Secondary | ICD-10-CM | POA: Diagnosis not present

## 2022-10-09 DIAGNOSIS — H25811 Combined forms of age-related cataract, right eye: Secondary | ICD-10-CM | POA: Diagnosis not present

## 2022-10-09 DIAGNOSIS — Z961 Presence of intraocular lens: Secondary | ICD-10-CM | POA: Diagnosis not present

## 2022-11-06 DIAGNOSIS — H25811 Combined forms of age-related cataract, right eye: Secondary | ICD-10-CM | POA: Diagnosis not present

## 2022-11-06 DIAGNOSIS — Z961 Presence of intraocular lens: Secondary | ICD-10-CM | POA: Diagnosis not present

## 2022-11-06 DIAGNOSIS — H353211 Exudative age-related macular degeneration, right eye, with active choroidal neovascularization: Secondary | ICD-10-CM | POA: Diagnosis not present

## 2022-11-06 DIAGNOSIS — H353122 Nonexudative age-related macular degeneration, left eye, intermediate dry stage: Secondary | ICD-10-CM | POA: Diagnosis not present

## 2022-11-06 DIAGNOSIS — H5315 Visual distortions of shape and size: Secondary | ICD-10-CM | POA: Diagnosis not present

## 2022-12-20 DIAGNOSIS — H353211 Exudative age-related macular degeneration, right eye, with active choroidal neovascularization: Secondary | ICD-10-CM | POA: Diagnosis not present

## 2022-12-20 DIAGNOSIS — Z961 Presence of intraocular lens: Secondary | ICD-10-CM | POA: Diagnosis not present

## 2022-12-20 DIAGNOSIS — H353122 Nonexudative age-related macular degeneration, left eye, intermediate dry stage: Secondary | ICD-10-CM | POA: Diagnosis not present

## 2022-12-20 DIAGNOSIS — H25811 Combined forms of age-related cataract, right eye: Secondary | ICD-10-CM | POA: Diagnosis not present

## 2022-12-20 DIAGNOSIS — H5315 Visual distortions of shape and size: Secondary | ICD-10-CM | POA: Diagnosis not present

## 2023-01-29 DIAGNOSIS — H353211 Exudative age-related macular degeneration, right eye, with active choroidal neovascularization: Secondary | ICD-10-CM | POA: Diagnosis not present

## 2023-01-29 DIAGNOSIS — H2511 Age-related nuclear cataract, right eye: Secondary | ICD-10-CM | POA: Diagnosis not present

## 2023-01-29 DIAGNOSIS — H3561 Retinal hemorrhage, right eye: Secondary | ICD-10-CM | POA: Diagnosis not present

## 2023-01-29 DIAGNOSIS — Z961 Presence of intraocular lens: Secondary | ICD-10-CM | POA: Diagnosis not present

## 2023-01-29 DIAGNOSIS — H353122 Nonexudative age-related macular degeneration, left eye, intermediate dry stage: Secondary | ICD-10-CM | POA: Diagnosis not present

## 2023-02-26 DIAGNOSIS — H2511 Age-related nuclear cataract, right eye: Secondary | ICD-10-CM | POA: Diagnosis not present

## 2023-02-26 DIAGNOSIS — H353122 Nonexudative age-related macular degeneration, left eye, intermediate dry stage: Secondary | ICD-10-CM | POA: Diagnosis not present

## 2023-02-26 DIAGNOSIS — H353211 Exudative age-related macular degeneration, right eye, with active choroidal neovascularization: Secondary | ICD-10-CM | POA: Diagnosis not present

## 2023-02-26 DIAGNOSIS — H3561 Retinal hemorrhage, right eye: Secondary | ICD-10-CM | POA: Diagnosis not present

## 2023-02-26 DIAGNOSIS — Z961 Presence of intraocular lens: Secondary | ICD-10-CM | POA: Diagnosis not present

## 2023-03-03 DIAGNOSIS — M5432 Sciatica, left side: Secondary | ICD-10-CM | POA: Diagnosis not present

## 2023-03-03 DIAGNOSIS — M545 Low back pain, unspecified: Secondary | ICD-10-CM | POA: Diagnosis not present

## 2023-03-13 DIAGNOSIS — R000 Tachycardia, unspecified: Secondary | ICD-10-CM | POA: Diagnosis not present

## 2023-03-13 DIAGNOSIS — M549 Dorsalgia, unspecified: Secondary | ICD-10-CM | POA: Diagnosis not present

## 2023-03-13 DIAGNOSIS — M5442 Lumbago with sciatica, left side: Secondary | ICD-10-CM | POA: Diagnosis not present

## 2023-03-13 DIAGNOSIS — R6889 Other general symptoms and signs: Secondary | ICD-10-CM | POA: Diagnosis not present

## 2023-03-13 DIAGNOSIS — F17210 Nicotine dependence, cigarettes, uncomplicated: Secondary | ICD-10-CM | POA: Diagnosis not present

## 2023-03-13 DIAGNOSIS — Z87891 Personal history of nicotine dependence: Secondary | ICD-10-CM | POA: Diagnosis not present

## 2023-03-20 DIAGNOSIS — M5136 Other intervertebral disc degeneration, lumbar region with discogenic back pain only: Secondary | ICD-10-CM | POA: Diagnosis not present

## 2023-03-20 DIAGNOSIS — S39012A Strain of muscle, fascia and tendon of lower back, initial encounter: Secondary | ICD-10-CM | POA: Diagnosis not present

## 2023-03-26 DIAGNOSIS — M5136 Other intervertebral disc degeneration, lumbar region with discogenic back pain only: Secondary | ICD-10-CM | POA: Diagnosis not present

## 2023-03-26 DIAGNOSIS — M5432 Sciatica, left side: Secondary | ICD-10-CM | POA: Diagnosis not present

## 2023-03-28 DIAGNOSIS — R531 Weakness: Secondary | ICD-10-CM | POA: Diagnosis not present

## 2023-03-28 DIAGNOSIS — M545 Low back pain, unspecified: Secondary | ICD-10-CM | POA: Diagnosis not present

## 2023-03-30 DIAGNOSIS — M545 Low back pain, unspecified: Secondary | ICD-10-CM | POA: Diagnosis not present

## 2023-03-30 DIAGNOSIS — R531 Weakness: Secondary | ICD-10-CM | POA: Diagnosis not present

## 2023-04-02 DIAGNOSIS — R531 Weakness: Secondary | ICD-10-CM | POA: Diagnosis not present

## 2023-04-02 DIAGNOSIS — M545 Low back pain, unspecified: Secondary | ICD-10-CM | POA: Diagnosis not present

## 2023-04-04 DIAGNOSIS — M545 Low back pain, unspecified: Secondary | ICD-10-CM | POA: Diagnosis not present

## 2023-04-04 DIAGNOSIS — R531 Weakness: Secondary | ICD-10-CM | POA: Diagnosis not present

## 2023-04-09 DIAGNOSIS — R531 Weakness: Secondary | ICD-10-CM | POA: Diagnosis not present

## 2023-04-09 DIAGNOSIS — M545 Low back pain, unspecified: Secondary | ICD-10-CM | POA: Diagnosis not present

## 2023-04-11 DIAGNOSIS — M545 Low back pain, unspecified: Secondary | ICD-10-CM | POA: Diagnosis not present

## 2023-04-11 DIAGNOSIS — R531 Weakness: Secondary | ICD-10-CM | POA: Diagnosis not present

## 2023-04-16 DIAGNOSIS — R531 Weakness: Secondary | ICD-10-CM | POA: Diagnosis not present

## 2023-04-16 DIAGNOSIS — M545 Low back pain, unspecified: Secondary | ICD-10-CM | POA: Diagnosis not present

## 2023-04-18 DIAGNOSIS — R531 Weakness: Secondary | ICD-10-CM | POA: Diagnosis not present

## 2023-04-18 DIAGNOSIS — M545 Low back pain, unspecified: Secondary | ICD-10-CM | POA: Diagnosis not present

## 2023-04-19 DIAGNOSIS — H353122 Nonexudative age-related macular degeneration, left eye, intermediate dry stage: Secondary | ICD-10-CM | POA: Diagnosis not present

## 2023-04-19 DIAGNOSIS — Z720 Tobacco use: Secondary | ICD-10-CM | POA: Diagnosis not present

## 2023-04-19 DIAGNOSIS — H3561 Retinal hemorrhage, right eye: Secondary | ICD-10-CM | POA: Diagnosis not present

## 2023-04-19 DIAGNOSIS — Z961 Presence of intraocular lens: Secondary | ICD-10-CM | POA: Diagnosis not present

## 2023-04-19 DIAGNOSIS — H25811 Combined forms of age-related cataract, right eye: Secondary | ICD-10-CM | POA: Diagnosis not present

## 2023-04-19 DIAGNOSIS — H353211 Exudative age-related macular degeneration, right eye, with active choroidal neovascularization: Secondary | ICD-10-CM | POA: Diagnosis not present

## 2023-04-23 DIAGNOSIS — M545 Low back pain, unspecified: Secondary | ICD-10-CM | POA: Diagnosis not present

## 2023-04-23 DIAGNOSIS — R531 Weakness: Secondary | ICD-10-CM | POA: Diagnosis not present

## 2023-04-25 DIAGNOSIS — R531 Weakness: Secondary | ICD-10-CM | POA: Diagnosis not present

## 2023-04-25 DIAGNOSIS — M545 Low back pain, unspecified: Secondary | ICD-10-CM | POA: Diagnosis not present

## 2023-04-30 DIAGNOSIS — R531 Weakness: Secondary | ICD-10-CM | POA: Diagnosis not present

## 2023-04-30 DIAGNOSIS — M545 Low back pain, unspecified: Secondary | ICD-10-CM | POA: Diagnosis not present

## 2023-05-07 DIAGNOSIS — R531 Weakness: Secondary | ICD-10-CM | POA: Diagnosis not present

## 2023-05-07 DIAGNOSIS — M545 Low back pain, unspecified: Secondary | ICD-10-CM | POA: Diagnosis not present

## 2023-05-09 DIAGNOSIS — R531 Weakness: Secondary | ICD-10-CM | POA: Diagnosis not present

## 2023-05-09 DIAGNOSIS — M545 Low back pain, unspecified: Secondary | ICD-10-CM | POA: Diagnosis not present

## 2023-05-14 DIAGNOSIS — R531 Weakness: Secondary | ICD-10-CM | POA: Diagnosis not present

## 2023-05-14 DIAGNOSIS — M545 Low back pain, unspecified: Secondary | ICD-10-CM | POA: Diagnosis not present

## 2023-05-16 DIAGNOSIS — R531 Weakness: Secondary | ICD-10-CM | POA: Diagnosis not present

## 2023-05-16 DIAGNOSIS — M545 Low back pain, unspecified: Secondary | ICD-10-CM | POA: Diagnosis not present

## 2023-05-21 DIAGNOSIS — R531 Weakness: Secondary | ICD-10-CM | POA: Diagnosis not present

## 2023-05-21 DIAGNOSIS — M545 Low back pain, unspecified: Secondary | ICD-10-CM | POA: Diagnosis not present

## 2023-05-24 DIAGNOSIS — H353122 Nonexudative age-related macular degeneration, left eye, intermediate dry stage: Secondary | ICD-10-CM | POA: Diagnosis not present

## 2023-05-24 DIAGNOSIS — H25811 Combined forms of age-related cataract, right eye: Secondary | ICD-10-CM | POA: Diagnosis not present

## 2023-05-24 DIAGNOSIS — Z961 Presence of intraocular lens: Secondary | ICD-10-CM | POA: Diagnosis not present

## 2023-05-24 DIAGNOSIS — H353211 Exudative age-related macular degeneration, right eye, with active choroidal neovascularization: Secondary | ICD-10-CM | POA: Diagnosis not present

## 2023-05-24 DIAGNOSIS — H3561 Retinal hemorrhage, right eye: Secondary | ICD-10-CM | POA: Diagnosis not present

## 2023-05-28 DIAGNOSIS — R531 Weakness: Secondary | ICD-10-CM | POA: Diagnosis not present

## 2023-05-28 DIAGNOSIS — M545 Low back pain, unspecified: Secondary | ICD-10-CM | POA: Diagnosis not present

## 2023-06-04 DIAGNOSIS — M545 Low back pain, unspecified: Secondary | ICD-10-CM | POA: Diagnosis not present

## 2023-06-04 DIAGNOSIS — R531 Weakness: Secondary | ICD-10-CM | POA: Diagnosis not present

## 2023-06-25 DIAGNOSIS — H25811 Combined forms of age-related cataract, right eye: Secondary | ICD-10-CM | POA: Diagnosis not present

## 2023-06-25 DIAGNOSIS — Z961 Presence of intraocular lens: Secondary | ICD-10-CM | POA: Diagnosis not present

## 2023-06-25 DIAGNOSIS — H353122 Nonexudative age-related macular degeneration, left eye, intermediate dry stage: Secondary | ICD-10-CM | POA: Diagnosis not present

## 2023-06-25 DIAGNOSIS — H353211 Exudative age-related macular degeneration, right eye, with active choroidal neovascularization: Secondary | ICD-10-CM | POA: Diagnosis not present

## 2023-06-25 DIAGNOSIS — H35033 Hypertensive retinopathy, bilateral: Secondary | ICD-10-CM | POA: Diagnosis not present

## 2023-06-25 DIAGNOSIS — Z720 Tobacco use: Secondary | ICD-10-CM | POA: Diagnosis not present

## 2023-08-06 DIAGNOSIS — Z961 Presence of intraocular lens: Secondary | ICD-10-CM | POA: Diagnosis not present

## 2023-08-06 DIAGNOSIS — H3561 Retinal hemorrhage, right eye: Secondary | ICD-10-CM | POA: Diagnosis not present

## 2023-08-06 DIAGNOSIS — H353122 Nonexudative age-related macular degeneration, left eye, intermediate dry stage: Secondary | ICD-10-CM | POA: Diagnosis not present

## 2023-08-06 DIAGNOSIS — H25811 Combined forms of age-related cataract, right eye: Secondary | ICD-10-CM | POA: Diagnosis not present

## 2023-08-06 DIAGNOSIS — H353211 Exudative age-related macular degeneration, right eye, with active choroidal neovascularization: Secondary | ICD-10-CM | POA: Diagnosis not present

## 2023-09-24 DIAGNOSIS — H5315 Visual distortions of shape and size: Secondary | ICD-10-CM | POA: Diagnosis not present

## 2023-09-24 DIAGNOSIS — Z961 Presence of intraocular lens: Secondary | ICD-10-CM | POA: Diagnosis not present

## 2023-09-24 DIAGNOSIS — H3561 Retinal hemorrhage, right eye: Secondary | ICD-10-CM | POA: Diagnosis not present

## 2023-09-24 DIAGNOSIS — H25811 Combined forms of age-related cataract, right eye: Secondary | ICD-10-CM | POA: Diagnosis not present

## 2023-09-24 DIAGNOSIS — H353211 Exudative age-related macular degeneration, right eye, with active choroidal neovascularization: Secondary | ICD-10-CM | POA: Diagnosis not present

## 2023-09-24 DIAGNOSIS — H353122 Nonexudative age-related macular degeneration, left eye, intermediate dry stage: Secondary | ICD-10-CM | POA: Diagnosis not present

## 2023-11-05 DIAGNOSIS — H43811 Vitreous degeneration, right eye: Secondary | ICD-10-CM | POA: Diagnosis not present

## 2023-11-05 DIAGNOSIS — H25811 Combined forms of age-related cataract, right eye: Secondary | ICD-10-CM | POA: Diagnosis not present

## 2023-11-05 DIAGNOSIS — H353122 Nonexudative age-related macular degeneration, left eye, intermediate dry stage: Secondary | ICD-10-CM | POA: Diagnosis not present

## 2023-11-05 DIAGNOSIS — Z961 Presence of intraocular lens: Secondary | ICD-10-CM | POA: Diagnosis not present

## 2023-11-05 DIAGNOSIS — H353211 Exudative age-related macular degeneration, right eye, with active choroidal neovascularization: Secondary | ICD-10-CM | POA: Diagnosis not present
